# Patient Record
Sex: Female | Born: 1965 | Hispanic: Yes | Marital: Single | Smoking: Never smoker
Health system: Southern US, Community
[De-identification: ages and names within clinical notes are randomized; demographics above are authoritative.]

## PROBLEM LIST (undated history)

## (undated) DIAGNOSIS — E7800 Pure hypercholesterolemia, unspecified: Secondary | ICD-10-CM

## (undated) DIAGNOSIS — R519 Headache, unspecified: Secondary | ICD-10-CM

## (undated) HISTORY — PX: BACK SURGERY: SHX140

## (undated) HISTORY — PX: ABDOMINAL HYSTERECTOMY: SHX81

## (undated) HISTORY — PX: OOPHORECTOMY: SHX86

## (undated) HISTORY — PX: HALLUX VALGUS CORRECTION: SUR315

---

## 2016-11-17 ENCOUNTER — Other Ambulatory Visit: Payer: Self-pay | Admitting: Obstetrics and Gynecology

## 2016-11-17 DIAGNOSIS — Z1231 Encounter for screening mammogram for malignant neoplasm of breast: Secondary | ICD-10-CM

## 2016-12-12 ENCOUNTER — Encounter (HOSPITAL_COMMUNITY): Payer: Self-pay

## 2016-12-12 ENCOUNTER — Ambulatory Visit
Admission: RE | Admit: 2016-12-12 | Discharge: 2016-12-12 | Disposition: A | Discharge status: Home / Self Care | Payer: BLUE CROSS/BLUE SHIELD | Source: Ambulatory Visit | Attending: Obstetrics and Gynecology | Admitting: Obstetrics and Gynecology

## 2016-12-12 DIAGNOSIS — N6310 Unspecified lump in the right breast, unspecified quadrant: Secondary | ICD-10-CM | POA: Insufficient documentation

## 2016-12-12 DIAGNOSIS — Z1231 Encounter for screening mammogram for malignant neoplasm of breast: Secondary | ICD-10-CM | POA: Insufficient documentation

## 2016-12-23 ENCOUNTER — Other Ambulatory Visit: Payer: Self-pay | Admitting: Obstetrics and Gynecology

## 2016-12-23 DIAGNOSIS — N6310 Unspecified lump in the right breast, unspecified quadrant: Secondary | ICD-10-CM

## 2016-12-23 DIAGNOSIS — R928 Other abnormal and inconclusive findings on diagnostic imaging of breast: Secondary | ICD-10-CM

## 2017-01-02 ENCOUNTER — Ambulatory Visit
Admission: RE | Admit: 2017-01-02 | Discharge: 2017-01-02 | Disposition: A | Discharge status: Home / Self Care | Payer: BLUE CROSS/BLUE SHIELD | Source: Ambulatory Visit | Attending: Obstetrics and Gynecology | Admitting: Obstetrics and Gynecology

## 2017-01-02 DIAGNOSIS — N6310 Unspecified lump in the right breast, unspecified quadrant: Secondary | ICD-10-CM

## 2017-01-02 DIAGNOSIS — N6001 Solitary cyst of right breast: Secondary | ICD-10-CM | POA: Insufficient documentation

## 2017-01-02 DIAGNOSIS — R928 Other abnormal and inconclusive findings on diagnostic imaging of breast: Secondary | ICD-10-CM

## 2017-11-17 ENCOUNTER — Other Ambulatory Visit: Payer: Self-pay | Admitting: Obstetrics and Gynecology

## 2017-11-17 DIAGNOSIS — Z1231 Encounter for screening mammogram for malignant neoplasm of breast: Secondary | ICD-10-CM

## 2017-12-18 ENCOUNTER — Ambulatory Visit
Admission: RE | Admit: 2017-12-18 | Discharge: 2017-12-18 | Disposition: A | Discharge status: Home / Self Care | Payer: BLUE CROSS/BLUE SHIELD | Source: Ambulatory Visit | Attending: Obstetrics and Gynecology | Admitting: Obstetrics and Gynecology

## 2017-12-18 DIAGNOSIS — Z1231 Encounter for screening mammogram for malignant neoplasm of breast: Secondary | ICD-10-CM | POA: Diagnosis not present

## 2019-07-27 ENCOUNTER — Other Ambulatory Visit: Payer: Self-pay | Admitting: *Deleted

## 2019-07-27 DIAGNOSIS — Z20822 Contact with and (suspected) exposure to covid-19: Secondary | ICD-10-CM

## 2019-07-29 LAB — NOVEL CORONAVIRUS, NAA: SARS-CoV-2, NAA: NOT DETECTED

## 2019-11-25 ENCOUNTER — Other Ambulatory Visit: Payer: Self-pay | Admitting: Certified Nurse Midwife

## 2019-11-25 DIAGNOSIS — Z1231 Encounter for screening mammogram for malignant neoplasm of breast: Secondary | ICD-10-CM

## 2020-01-06 ENCOUNTER — Ambulatory Visit
Admission: RE | Admit: 2020-01-06 | Discharge: 2020-01-06 | Disposition: A | Discharge status: Home / Self Care | Payer: BLUE CROSS/BLUE SHIELD | Source: Ambulatory Visit | Attending: Certified Nurse Midwife | Admitting: Certified Nurse Midwife

## 2020-01-06 DIAGNOSIS — Z1231 Encounter for screening mammogram for malignant neoplasm of breast: Secondary | ICD-10-CM | POA: Insufficient documentation

## 2020-01-21 ENCOUNTER — Ambulatory Visit: Payer: BLUE CROSS/BLUE SHIELD | Attending: Internal Medicine

## 2020-01-21 DIAGNOSIS — Z23 Encounter for immunization: Secondary | ICD-10-CM

## 2020-01-21 NOTE — Progress Notes (Signed)
   Covid-19 Vaccination Clinic  Name:  Colleen Boyle    MRN: [id number] DOB: 1966-08-18  01/21/2020  [name] was observed post Covid-19 immunization for 15 minutes without incident. She was provided with Vaccine Information Sheet and instruction to access the V-Safe system.   [name] was instructed to call 911 with any severe reactions post vaccine: Marland Kitchen Difficulty breathing  . Swelling of face and throat  . A fast heartbeat  . A bad rash all over body  . Dizziness and weakness   Immunizations Administered    Name Date Dose VIS Date Route   Pfizer COVID-19 Vaccine 01/21/2020 10:55 AM 0.3 mL 10/14/2019 Intramuscular   Manufacturer: [place]   Lot: [place]   [place]: [id number]

## 2020-02-11 ENCOUNTER — Ambulatory Visit: Payer: BLUE CROSS/BLUE SHIELD | Attending: Internal Medicine

## 2020-02-11 DIAGNOSIS — Z23 Encounter for immunization: Secondary | ICD-10-CM

## 2020-02-11 NOTE — Progress Notes (Signed)
   Covid-19 Vaccination Clinic  Name:  Colleen Boyle    MRN: [id number] DOB: Jun 17, 1966  02/11/2020  [name] was observed post Covid-19 immunization for 15 minutes without incident. She was provided with Vaccine Information Sheet and instruction to access the V-Safe system.   [name] was instructed to call 911 with any severe reactions post vaccine: Marland Kitchen Difficulty breathing  . Swelling of face and throat  . A fast heartbeat  . A bad rash all over body  . Dizziness and weakness   Immunizations Administered    Name Date Dose VIS Date Route   Pfizer COVID-19 Vaccine 02/11/2020 10:43 AM 0.3 mL 10/14/2019 Intramuscular   Manufacturer: [place]   Lot: [id number]   [place]: [id number]

## 2021-01-09 ENCOUNTER — Other Ambulatory Visit: Payer: Self-pay | Admitting: Certified Nurse Midwife

## 2021-01-09 DIAGNOSIS — N644 Mastodynia: Secondary | ICD-10-CM

## 2021-01-25 ENCOUNTER — Other Ambulatory Visit: Payer: BLUE CROSS/BLUE SHIELD

## 2021-02-28 ENCOUNTER — Other Ambulatory Visit: Payer: Self-pay

## 2021-02-28 ENCOUNTER — Ambulatory Visit
Admission: RE | Admit: 2021-02-28 | Discharge: 2021-02-28 | Disposition: A | Discharge status: Home / Self Care | Payer: 59 | Source: Ambulatory Visit | Attending: Certified Nurse Midwife | Admitting: Certified Nurse Midwife

## 2021-02-28 DIAGNOSIS — N644 Mastodynia: Secondary | ICD-10-CM

## 2021-11-25 DIAGNOSIS — R7303 Prediabetes: Secondary | ICD-10-CM | POA: Diagnosis not present

## 2021-11-25 DIAGNOSIS — G43009 Migraine without aura, not intractable, without status migrainosus: Secondary | ICD-10-CM | POA: Diagnosis not present

## 2021-11-25 DIAGNOSIS — Z780 Asymptomatic menopausal state: Secondary | ICD-10-CM | POA: Diagnosis not present

## 2021-11-25 DIAGNOSIS — E7800 Pure hypercholesterolemia, unspecified: Secondary | ICD-10-CM | POA: Diagnosis not present

## 2022-01-03 ENCOUNTER — Encounter: Payer: Self-pay | Admitting: *Deleted

## 2022-01-06 ENCOUNTER — Encounter
Admission: RE | Disposition: A | Discharge status: Home / Self Care | Payer: Self-pay | Source: Home / Self Care | Attending: Gastroenterology

## 2022-01-06 ENCOUNTER — Encounter: Payer: Self-pay | Admitting: *Deleted

## 2022-01-06 ENCOUNTER — Ambulatory Visit: Payer: 59 | Admitting: Anesthesiology

## 2022-01-06 ENCOUNTER — Ambulatory Visit: Admit: 2022-01-06 | Payer: 59 | Admitting: Internal Medicine

## 2022-01-06 ENCOUNTER — Ambulatory Visit
Admission: RE | Admit: 2022-01-06 | Discharge: 2022-01-06 | Disposition: A | Discharge status: Home / Self Care | Payer: 59 | Attending: Gastroenterology | Admitting: Gastroenterology

## 2022-01-06 DIAGNOSIS — D123 Benign neoplasm of transverse colon: Secondary | ICD-10-CM | POA: Diagnosis not present

## 2022-01-06 DIAGNOSIS — K635 Polyp of colon: Secondary | ICD-10-CM | POA: Diagnosis not present

## 2022-01-06 DIAGNOSIS — E7800 Pure hypercholesterolemia, unspecified: Secondary | ICD-10-CM | POA: Insufficient documentation

## 2022-01-06 DIAGNOSIS — Z90721 Acquired absence of ovaries, unilateral: Secondary | ICD-10-CM | POA: Diagnosis not present

## 2022-01-06 DIAGNOSIS — K640 First degree hemorrhoids: Secondary | ICD-10-CM | POA: Insufficient documentation

## 2022-01-06 DIAGNOSIS — K649 Unspecified hemorrhoids: Secondary | ICD-10-CM | POA: Diagnosis not present

## 2022-01-06 DIAGNOSIS — R519 Headache, unspecified: Secondary | ICD-10-CM | POA: Diagnosis not present

## 2022-01-06 DIAGNOSIS — Z1211 Encounter for screening for malignant neoplasm of colon: Secondary | ICD-10-CM | POA: Insufficient documentation

## 2022-01-06 HISTORY — PX: COLONOSCOPY WITH PROPOFOL: SHX5780

## 2022-01-06 HISTORY — DX: Pure hypercholesterolemia, unspecified: E78.00

## 2022-01-06 HISTORY — DX: Headache, unspecified: R51.9

## 2022-01-06 SURGERY — COLONOSCOPY WITH PROPOFOL
Anesthesia: General

## 2022-01-06 MED ORDER — LIDOCAINE HCL (CARDIAC) PF 100 MG/5ML IV SOSY
PREFILLED_SYRINGE | INTRAVENOUS | Status: DC | PRN
  Administered 2022-01-06: 20 mg via INTRAVENOUS

## 2022-01-06 MED ORDER — PROPOFOL 10 MG/ML IV BOLUS
INTRAVENOUS | Status: AC
  Filled 2022-01-06: qty 40

## 2022-01-06 MED ORDER — PROPOFOL 10 MG/ML IV BOLUS
INTRAVENOUS | Status: DC | PRN
  Administered 2022-01-06: 30 mg via INTRAVENOUS
  Administered 2022-01-06: 70 mg via INTRAVENOUS

## 2022-01-06 MED ORDER — PROPOFOL 10 MG/ML IV BOLUS
INTRAVENOUS | Status: AC
  Filled 2022-01-06: qty 20

## 2022-01-06 MED ORDER — SODIUM CHLORIDE 0.9 % IV SOLN: INTRAVENOUS | Status: DC

## 2022-01-06 MED ORDER — PROPOFOL 500 MG/50ML IV EMUL
INTRAVENOUS | Status: DC | PRN
  Administered 2022-01-06: 175 ug/kg/min via INTRAVENOUS

## 2022-01-06 NOTE — Transfer of Care (Signed)
Immediate Anesthesia Transfer of Care Note ? ?Patient: Colleen Boyle ? ?Procedure(s) Performed: COLONOSCOPY WITH PROPOFOL ? ?Patient Location: Endoscopy Unit ? ?Anesthesia Type:General ? ?Level of Consciousness: awake ? ?Airway & Oxygen Therapy: Patient Spontanous Breathing ? ?Post-op Assessment: Report given to RN and Post -op Vital signs reviewed and stable ? ?Post vital signs: Reviewed and stable ? ?Last Vitals:  ?Vitals Value Taken Time  ?BP 92/58 01/06/22 1113  ?Temp    ?Pulse 69 01/06/22 1113  ?Resp 19 01/06/22 1113  ?SpO2 98 % 01/06/22 1113  ? ? ?Last Pain:  ?Vitals:  ? 01/06/22 1113  ?TempSrc:   ?PainSc: 0-No pain  ?   ? ?  ? ?Complications: No notable events documented. ?

## 2022-01-06 NOTE — Anesthesia Preprocedure Evaluation (Signed)
Anesthesia Evaluation  ?Patient identified by MRN, date of birth, ID band ?Patient awake ? ? ? ?Reviewed: ?Allergy & Precautions, H&P , NPO status , Patient's Chart, lab work & pertinent test results, reviewed documented beta blocker date and time  ? ?Airway ?Mallampati: II ? ? ?Neck ROM: full ? ? ? Dental ? ?(+) Poor Dentition ?  ?Pulmonary ?neg pulmonary ROS,  ?  ?Pulmonary exam normal ? ? ? ? ? ? ? Cardiovascular ?Exercise Tolerance: Good ?negative cardio ROS ?Normal cardiovascular exam ?Rhythm:regular Rate:Normal ? ? ?  ?Neuro/Psych ? Headaches, negative psych ROS  ? GI/Hepatic ?negative GI ROS, Neg liver ROS,   ?Endo/Other  ?negative endocrine ROS ? Renal/GU ?negative Renal ROS  ?negative genitourinary ?  ?Musculoskeletal ? ? Abdominal ?  ?Peds ? 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 Body Mass Index: 28.45 kg/m?  ?  ? Reproductive/Obstetrics ?negative OB ROS ? ?  ? ? ? ? ? ? ? ? ? ? ? ? ? ?  ?  ? ? ? ? ? ? ? ? ?Anesthesia Physical ?Anesthesia Plan ? ?ASA: 2 ? ?Anesthesia Plan: General  ? ?Post-op Pain Management:   ? ?Induction:  ? ?PONV Risk Score and Plan:  ? ?Airway Management Planned:  ? ?Additional Equipment:  ? ?Intra-op Plan:  ? ?Post-operative Plan:  ? ?Informed Consent: I have reviewed the patients History and Physical, chart, labs and discussed the procedure including the risks, benefits and alternatives for the proposed anesthesia with the patient or authorized representative who has indicated his/her understanding and acceptance.  ? ? ? ?Dental Advisory Given ? ?Plan Discussed with: CRNA ? ?Anesthesia Plan Comments:   ? ? ? ? ? ? ?Anesthesia Quick Evaluation ? ?

## 2022-01-06 NOTE — Interval H&P Note (Signed)
History and Physical Interval Note: ? ?01/06/2022 ?10:41 AM ? ?Colleen Boyle  has presented today for surgery, with the diagnosis of Screening.  The various methods of treatment have been discussed with the patient and family. After consideration of risks, benefits and other options for treatment, the patient has consented to  Procedure(s) with comments: ?COLONOSCOPY WITH PROPOFOL (N/A) - SPANISH INTERPRETER as a surgical intervention.  The patient's history has been reviewed, patient examined, no change in status, stable for surgery.  I have reviewed the patient's chart and labs.  Questions were answered to the patient's satisfaction.   ? ? ?Hilton Cork [place] ? ?Ok to proceed with colonoscopy ?

## 2022-01-06 NOTE — H&P (Signed)
Outpatient short stay form Pre-procedure ?01/06/2022  ?Lesly Rubenstein, MD ? ?Primary Physician: Linda Hedges, CNM ? ?Reason for visit:  Screening ? ?History of present illness:   ? ?56 y/o lady with no significant PMH here for index screening colonoscopy. No blood thinners. History of oophorectomy. No family history of GI malignancies. ? ? ? ?Current Facility-Administered Medications:  ?  0.9 %  sodium chloride infusion, , Intravenous, Continuous, Psalm Schappell, Hilton Cork, MD, Last Rate: 20 mL/hr at 01/06/22 1036, Continued from Pre-op at 01/06/22 1036 ? ?Medications Prior to Admission  ?Medication Sig Dispense Refill Last Dose  ? betamethasone dipropionate 0.05 % cream Apply 1 application topically 2 (two) times daily as needed.     ? calcium elemental as carbonate (TUMS ULTRA 1000) 400 MG chewable tablet Chew 1,000 mg by mouth every 2 (two) hours as needed for heartburn.     ? Cholecalciferol 100 MCG (4000 UT) CAPS Take 1 capsule by mouth daily.     ? nortriptyline (PAMELOR) 10 MG capsule Take 10 mg by mouth at bedtime.     ? senna (SENOKOT) 8.6 MG tablet Take 1 tablet by mouth daily.     ? simvastatin (ZOCOR) 40 MG tablet Take 40 mg by mouth daily.     ? SUMAtriptan (IMITREX) 50 MG tablet Take 50 mg by mouth every 2 (two) hours as needed for migraine. May repeat in 2 hours if headache persists or recurs. ?NO MORE THAN 2 TABLETS IN A DAY     ? ? ? ?No Known Allergies ? ? ?Past Medical History:  ?Diagnosis Date  ? Headache   ? Hypercholesteremia   ? ? ?Review of systems:  Otherwise negative.  ? ? ?Physical Exam ? ?Gen: Alert, oriented. Appears stated age.  ?HEENT: PERRLA. ?Lungs: No respiratory distress ?CV: RRR ?Abd: soft, benign, no masses ?Ext: No edema ? ? ? ?Planned procedures: Proceed with colonoscopy. The patient understands the nature of the planned procedure, indications, risks, alternatives and potential complications including but not limited to bleeding, infection, perforation, damage to internal organs  and possible oversedation/side effects from anesthesia. The patient agrees and gives consent to proceed.  ?Please refer to procedure notes for findings, recommendations and patient disposition/instructions.  ? ? ? ?Lesly Rubenstein, MD ?[facility] ? ? ? ?  ? ?

## 2022-01-06 NOTE — Op Note (Signed)
[facility] ?Gastroenterology ?Patient Name: Colleen Boyle ?Procedure Date: 01/06/2022 10:43 AM ?MRN: [id number] ?Account #: [id number] ?Date of Birth: [birth year] ?Admit Type: Outpatient ?Age: 56 ?Room: [facility] ENDO ROOM 3 ?Gender: Female ?Note Status: Finalized ?Instrument Name: Colonscope [id number] ?Procedure:             Colonoscopy ?Indications:           Screening for colorectal malignant neoplasm ?Providers:             Andrey Farmer MD, MD ?Referring MD:          Forest Gleason Md, MD (Referring MD) ?Medicines:             Monitored Anesthesia Care ?Complications:         No immediate complications. Estimated blood loss:  ?                       Minimal. ?Procedure:             Pre-Anesthesia Assessment: ?                       - Prior to the procedure, a History and Physical was  ?                       performed, and patient medications and allergies were  ?                       reviewed. The patient is competent. The risks and  ?                       benefits of the procedure and the sedation options and  ?                       risks were discussed with the patient. All questions  ?                       were answered and informed consent was obtained.  ?                       Patient identification and proposed procedure were  ?                       verified by the physician, the nurse, the  ?                       anesthesiologist, the anesthetist and the technician  ?                       in the endoscopy suite. Mental Status Examination:  ?                       alert and oriented. Airway Examination: normal  ?                       oropharyngeal airway and neck mobility. Respiratory  ?                       Examination: clear to auscultation. CV Examination:  ?  normal. Prophylactic Antibiotics: The patient does not  ?                       require prophylactic antibiotics. Prior  ?                       Anticoagulants: The patient has taken no previous  ?                        anticoagulant or antiplatelet agents. ASA Grade  ?                       Assessment: I - A normal, healthy patient. After  ?                       reviewing the risks and benefits, the patient was  ?                       deemed in satisfactory condition to undergo the  ?                       procedure. The anesthesia plan was to use monitored  ?                       anesthesia care (MAC). Immediately prior to  ?                       administration of medications, the patient was  ?                       re-assessed for adequacy to receive sedatives. The  ?                       heart rate, respiratory rate, oxygen saturations,  ?                       blood pressure, adequacy of pulmonary ventilation, and  ?                       response to care were monitored throughout the  ?                       procedure. The physical status of the patient was  ?                       re-assessed after the procedure. ?                       After obtaining informed consent, the colonoscope was  ?                       passed under direct vision. Throughout the procedure,  ?                       the patient's blood pressure, pulse, and oxygen  ?                       saturations were monitored continuously. The  ?  Colonoscope was introduced through the anus and  ?                       advanced to the the cecum, identified by appendiceal  ?                       orifice and ileocecal valve. The colonoscopy was  ?                       performed without difficulty. The patient tolerated  ?                       the procedure well. The quality of the bowel  ?                       preparation was good except the ascending colon was  ?                       poor. ?Findings: ?     The perianal and digital rectal examinations were normal. ?     Two sessile polyps were found in the proximal transverse colon. The  ?     polyps were 1 to 2 mm in size. These polyps were removed with a jumbo  ?     cold  forceps. Resection and retrieval were complete. Estimated blood  ?     loss was minimal. ?     Two sessile and semi-pedunculated polyps were found in the proximal  ?     transverse colon. The polyps were 3 to 4 mm in size. These polyps were  ?     removed with a cold snare. Resection and retrieval were complete.  ?     Estimated blood loss was minimal. ?     A large amount of stool was found in the ascending colon and in the  ?     cecum, precluding visualization. ?     Internal hemorrhoids were found during retroflexion. The hemorrhoids  ?     were Grade I (internal hemorrhoids that do not prolapse). ?     The exam was otherwise without abnormality on direct and retroflexion  ?     views. ?Impression:            - Two 1 to 2 mm polyps in the proximal transverse  ?                       colon, removed with a jumbo cold forceps. Resected and  ?                       retrieved. ?                       - Two 3 to 4 mm polyps in the proximal transverse  ?                       colon, removed with a cold snare. Resected and  ?                       retrieved. ?                       -  Stool in the ascending colon and in the cecum. ?                       - Internal hemorrhoids. ?                       - The examination was otherwise normal on direct and  ?                       retroflexion views. ?Recommendation:        - Discharge patient to home. ?                       - Resume previous diet. ?                       - Continue present medications. ?                       - Await pathology results. ?                       - Repeat colonoscopy in 6 months because the bowel  ?                       preparation was suboptimal. ?                       - Return to referring physician as previously  ?                       scheduled. ?Procedure Code(s):     --- Professional --- ?                       [phone], Colonoscopy, flexible; with removal of  ?                       tumor(s), polyp(s), or other lesion(s) by snare  ?                        technique ?                       [id number], 59, Colonoscopy, flexible; with biopsy, single  ?                       or multiple ?Diagnosis Code(s):     --- Professional --- ?                       Z12.11, Encounter for screening for malignant neoplasm  ?                       of colon ?                       K63.5, Polyp of colon ?                       K64.0, First degree hemorrhoids ?CPT copyright 2019 American Medical Association. All rights reserved. ?The codes documented in this report are preliminary and upon coder review may  ?be revised to meet current compliance  requirements. ?Andrey Farmer MD, MD ?01/06/2022 11:11:08 AM ?Number of Addenda: 0 ?Note Initiated On: 01/06/2022 10:43 AM ?Scope Withdrawal Time: 0 hours 2 minutes 42 seconds  ?Total Procedure Duration: 0 hours 13 minutes 33 seconds  ?Estimated Blood Loss:  Estimated blood loss was minimal. ?     [facility] ?

## 2022-01-07 ENCOUNTER — Encounter: Payer: Self-pay | Admitting: Gastroenterology

## 2022-01-07 LAB — SURGICAL PATHOLOGY

## 2022-01-09 ENCOUNTER — Other Ambulatory Visit: Payer: Self-pay | Admitting: Certified Nurse Midwife

## 2022-01-09 DIAGNOSIS — Z1231 Encounter for screening mammogram for malignant neoplasm of breast: Secondary | ICD-10-CM

## 2022-01-09 DIAGNOSIS — Z124 Encounter for screening for malignant neoplasm of cervix: Secondary | ICD-10-CM | POA: Diagnosis not present

## 2022-01-09 DIAGNOSIS — Z01419 Encounter for gynecological examination (general) (routine) without abnormal findings: Secondary | ICD-10-CM | POA: Diagnosis not present

## 2022-01-12 NOTE — Anesthesia Postprocedure Evaluation (Signed)
Anesthesia Post Note ? ?Patient: Colleen Boyle ? ?Procedure(s) Performed: COLONOSCOPY WITH PROPOFOL ? ?Patient location during evaluation: PACU ?Anesthesia Type: General ?Level of consciousness: awake and alert ?Pain management: pain level controlled ?Vital Signs Assessment: post-procedure vital signs reviewed and stable ?Respiratory status: spontaneous breathing, nonlabored ventilation, respiratory function stable and patient connected to nasal cannula oxygen ?Cardiovascular status: blood pressure returned to baseline and stable ?Postop Assessment: no apparent nausea or vomiting ?Anesthetic complications: no ? ? ?No notable events documented. ? ? ?Last Vitals:  ?Vitals:  ? 01/06/22 1116 01/06/22 1141  ?BP:  110/72  ?Pulse:    ?Resp:    ?Temp: (!) 35.6 ?C   ?SpO2:    ?  ?Last Pain:  ?Vitals:  ? 01/06/22 1156  ?TempSrc:   ?PainSc: 0-No pain  ? ? ?  ?  ?  ?  ?  ?  ? ?Molli Barrows ? ? ? ? ?

## 2022-03-07 ENCOUNTER — Ambulatory Visit
Admission: RE | Admit: 2022-03-07 | Discharge: 2022-03-07 | Disposition: A | Discharge status: Home / Self Care | Payer: 59 | Source: Ambulatory Visit | Attending: Certified Nurse Midwife | Admitting: Certified Nurse Midwife

## 2022-03-07 DIAGNOSIS — Z1231 Encounter for screening mammogram for malignant neoplasm of breast: Secondary | ICD-10-CM | POA: Insufficient documentation

## 2022-05-26 DIAGNOSIS — Z780 Asymptomatic menopausal state: Secondary | ICD-10-CM | POA: Diagnosis not present

## 2022-05-26 DIAGNOSIS — E7800 Pure hypercholesterolemia, unspecified: Secondary | ICD-10-CM | POA: Diagnosis not present

## 2022-05-26 DIAGNOSIS — R7303 Prediabetes: Secondary | ICD-10-CM | POA: Diagnosis not present

## 2022-05-30 DIAGNOSIS — M25561 Pain in right knee: Secondary | ICD-10-CM | POA: Diagnosis not present

## 2022-05-30 DIAGNOSIS — E7800 Pure hypercholesterolemia, unspecified: Secondary | ICD-10-CM | POA: Diagnosis not present

## 2022-05-30 DIAGNOSIS — Z0000 Encounter for general adult medical examination without abnormal findings: Secondary | ICD-10-CM | POA: Diagnosis not present

## 2022-05-30 DIAGNOSIS — G8929 Other chronic pain: Secondary | ICD-10-CM | POA: Diagnosis not present

## 2022-05-30 DIAGNOSIS — G43009 Migraine without aura, not intractable, without status migrainosus: Secondary | ICD-10-CM | POA: Diagnosis not present

## 2022-05-30 DIAGNOSIS — R7303 Prediabetes: Secondary | ICD-10-CM | POA: Diagnosis not present

## 2022-07-11 ENCOUNTER — Ambulatory Visit: Payer: 59 | Admitting: Anesthesiology

## 2022-07-11 ENCOUNTER — Ambulatory Visit
Admission: RE | Admit: 2022-07-11 | Discharge: 2022-07-11 | Disposition: A | Discharge status: Home / Self Care | Payer: 59 | Source: Ambulatory Visit | Attending: Gastroenterology | Admitting: Gastroenterology

## 2022-07-11 ENCOUNTER — Encounter
Admission: RE | Disposition: A | Discharge status: Home / Self Care | Payer: Self-pay | Source: Ambulatory Visit | Attending: Gastroenterology

## 2022-07-11 ENCOUNTER — Encounter: Payer: Self-pay | Admitting: *Deleted

## 2022-07-11 DIAGNOSIS — K640 First degree hemorrhoids: Secondary | ICD-10-CM | POA: Diagnosis not present

## 2022-07-11 DIAGNOSIS — Z1211 Encounter for screening for malignant neoplasm of colon: Secondary | ICD-10-CM | POA: Insufficient documentation

## 2022-07-11 DIAGNOSIS — E7800 Pure hypercholesterolemia, unspecified: Secondary | ICD-10-CM | POA: Insufficient documentation

## 2022-07-11 DIAGNOSIS — Z86010 Personal history of colonic polyps: Secondary | ICD-10-CM | POA: Diagnosis not present

## 2022-07-11 DIAGNOSIS — Z90721 Acquired absence of ovaries, unilateral: Secondary | ICD-10-CM | POA: Insufficient documentation

## 2022-07-11 DIAGNOSIS — Z90710 Acquired absence of both cervix and uterus: Secondary | ICD-10-CM | POA: Diagnosis not present

## 2022-07-11 HISTORY — PX: COLONOSCOPY WITH PROPOFOL: SHX5780

## 2022-07-11 SURGERY — COLONOSCOPY WITH PROPOFOL
Anesthesia: General

## 2022-07-11 MED ORDER — SODIUM CHLORIDE 0.9 % IV SOLN: INTRAVENOUS | Status: DC

## 2022-07-11 MED ORDER — EPHEDRINE 5 MG/ML INJ
INTRAVENOUS | Status: AC
  Filled 2022-07-11: qty 5

## 2022-07-11 MED ORDER — PROPOFOL 500 MG/50ML IV EMUL
INTRAVENOUS | Status: DC | PRN
  Administered 2022-07-11: 150 ug/kg/min via INTRAVENOUS

## 2022-07-11 MED ORDER — PROPOFOL 10 MG/ML IV BOLUS
INTRAVENOUS | Status: DC | PRN
  Administered 2022-07-11: 70 mg via INTRAVENOUS

## 2022-07-11 MED ORDER — PHENYLEPHRINE HCL (PRESSORS) 10 MG/ML IV SOLN
INTRAVENOUS | Status: DC | PRN
  Administered 2022-07-11: 80 ug via INTRAVENOUS

## 2022-07-11 MED ORDER — PHENYLEPHRINE 80 MCG/ML (10ML) SYRINGE FOR IV PUSH (FOR BLOOD PRESSURE SUPPORT)
PREFILLED_SYRINGE | INTRAVENOUS | Status: AC
  Filled 2022-07-11: qty 10

## 2022-07-11 MED ORDER — EPHEDRINE SULFATE (PRESSORS) 50 MG/ML IJ SOLN
INTRAMUSCULAR | Status: DC | PRN
  Administered 2022-07-11: 10 mg via INTRAVENOUS
  Administered 2022-07-11 (×3): 5 mg via INTRAVENOUS

## 2022-07-11 NOTE — Anesthesia Procedure Notes (Signed)
Date/Time: 07/11/2022 10:05 AM  Performed by: Demetrius Charity, CRNAPre-anesthesia Checklist: Patient identified, Emergency Drugs available, Suction available, Patient being monitored and Timeout performed Patient Re-evaluated:Patient Re-evaluated prior to induction Oxygen Delivery Method: Nasal cannula Induction Type: IV induction Placement Confirmation: CO2 detector and positive ETCO2

## 2022-07-11 NOTE — H&P (Signed)
Outpatient short stay form Pre-procedure 07/11/2022  Lesly Rubenstein, MD  Primary Physician: Linda Hedges, CNM  Reason for visit:  Surveillance colonoscopy  History of present illness:    56 y/o lady with no significant PMH here for colonoscopy. Last colonoscopy 6 months ago with poor prep. No blood thinners. History of oophorectomy. No family history of GI malignancies.    Current Facility-Administered Medications:    0.9 %  sodium chloride infusion, , Intravenous, Continuous, Martyna Thorns, Hilton Cork, MD, Last Rate: 20 mL/hr at 07/11/22 0950, Continued from Pre-op at 07/11/22 0950  Medications Prior to Admission  Medication Sig Dispense Refill Last Dose   calcium elemental as carbonate (TUMS ULTRA 1000) 400 MG chewable tablet Chew 1,000 mg by mouth every 2 (two) hours as needed for heartburn.   Past Week   Cholecalciferol 100 MCG (4000 UT) CAPS Take 1 capsule by mouth daily.   Past Week   nortriptyline (PAMELOR) 10 MG capsule Take 10 mg by mouth at bedtime.   Past Week   senna (SENOKOT) 8.6 MG tablet Take 1 tablet by mouth daily.   Past Week   simvastatin (ZOCOR) 40 MG tablet Take 40 mg by mouth daily.   Past Week   betamethasone dipropionate 0.05 % cream Apply 1 application topically 2 (two) times daily as needed.      SUMAtriptan (IMITREX) 50 MG tablet Take 50 mg by mouth every 2 (two) hours as needed for migraine. May repeat in 2 hours if headache persists or recurs. NO MORE THAN 2 TABLETS IN A DAY        No Known Allergies   Past Medical History:  Diagnosis Date   Headache    Hypercholesteremia     Review of systems:  Otherwise negative.    Physical Exam  Gen: Alert, oriented. Appears stated age.  HEENT: PERRLA. Lungs: No respiratory distress CV: RRR Abd: soft, benign, no masses Ext: No edema    Planned procedures: Proceed with colonoscopy. The patient understands the nature of the planned procedure, indications, risks, alternatives and potential  complications including but not limited to bleeding, infection, perforation, damage to internal organs and possible oversedation/side effects from anesthesia. The patient agrees and gives consent to proceed.  Please refer to procedure notes for findings, recommendations and patient disposition/instructions.     Lesly Rubenstein, MD [facility]

## 2022-07-11 NOTE — Op Note (Addendum)
[facility] Gastroenterology Patient Name: Colleen Boyle Procedure Date: 07/11/2022 9:52 AM MRN: [id number] Account #: [id number] Date of Birth: 1966/02/04 Admit Type: Outpatient Age: 56 Room: [facility] ENDO ROOM 3 Gender: Female Note Status: Finalized Instrument Name: Jasper Riling [id number] Procedure:             Colonoscopy Indications:           Surveillance: Personal history of adenomatous polyps,                         inadequate prep on last colonoscopy (less than 1 year                         ago) Providers:             Andrey Farmer MD, MD Referring MD:          No Local Md, MD (Referring MD) Medicines:             Monitored Anesthesia Care Complications:         No immediate complications. Procedure:             Pre-Anesthesia Assessment:                        - Prior to the procedure, a History and Physical was                         performed, and patient medications and allergies were                         reviewed. The patient is competent. The risks and                         benefits of the procedure and the sedation options and                         risks were discussed with the patient. All questions                         were answered and informed consent was obtained.                         Patient identification and proposed procedure were                         verified by the physician, the nurse, the                         anesthesiologist, the anesthetist and the technician                         in the endoscopy suite. Mental Status Examination:                         alert and oriented. Airway Examination: normal                         oropharyngeal airway and neck mobility. Respiratory  Examination: clear to auscultation. CV Examination:                         normal. Prophylactic Antibiotics: The patient does not                         require prophylactic antibiotics. Prior                          Anticoagulants: The patient has taken no previous                         anticoagulant or antiplatelet agents. ASA Grade                         Assessment: II - A patient with mild systemic disease.                         After reviewing the risks and benefits, the patient                         was deemed in satisfactory condition to undergo the                         procedure. The anesthesia plan was to use monitored                         anesthesia care (MAC). Immediately prior to                         administration of medications, the patient was                         re-assessed for adequacy to receive sedatives. The                         heart rate, respiratory rate, oxygen saturations,                         blood pressure, adequacy of pulmonary ventilation, and                         response to care were monitored throughout the                         procedure. The physical status of the patient was                         re-assessed after the procedure.                        After obtaining informed consent, the colonoscope was                         passed under direct vision. Throughout the procedure,                         the patient's blood pressure, pulse, and oxygen  saturations were monitored continuously. The                         Colonoscope was introduced through the anus and                         advanced to the the cecum, identified by appendiceal                         orifice and ileocecal valve. The colonoscopy was                         performed without difficulty. The patient tolerated                         the procedure well. The quality of the bowel                         preparation was good. Findings:      The perianal and digital rectal examinations were normal.      Internal hemorrhoids were found during retroflexion. The hemorrhoids       were Grade I (internal hemorrhoids that do not prolapse).       The exam was otherwise without abnormality on direct and retroflexion       views. Impression:            - Internal hemorrhoids.                        - The examination was otherwise normal on direct and                         retroflexion views.                        - No specimens collected. Recommendation:        - Discharge patient to home.                        - Resume previous diet.                        - Continue present medications.                        - Repeat colonoscopy in 5 years for surveillance.                        - Return to referring physician as previously                         scheduled. Procedure Code(s):     --- Professional ---                        G9924, Colorectal cancer screening; colonoscopy on                         individual at high risk Diagnosis Code(s):     --- Professional ---  Z86.010, Personal history of colonic polyps                        K64.0, First degree hemorrhoids CPT copyright 2019 American Medical Association. All rights reserved. The codes documented in this report are preliminary and upon coder review may  be revised to meet current compliance requirements. Andrey Farmer MD, MD 07/11/2022 10:34:37 AM Number of Addenda: 0 Note Initiated On: 07/11/2022 9:52 AM Scope Withdrawal Time: 0 hours 10 minutes 39 seconds  Total Procedure Duration: 0 hours 14 minutes 14 seconds  Estimated Blood Loss:  Estimated blood loss: none.      [facility]

## 2022-07-11 NOTE — Anesthesia Postprocedure Evaluation (Signed)
Anesthesia Post Note  Patient: Colleen Boyle  Procedure(s) Performed: COLONOSCOPY WITH PROPOFOL  Patient location during evaluation: Endoscopy Anesthesia Type: General Level of consciousness: awake and alert Pain management: pain level controlled Vital Signs Assessment: post-procedure vital signs reviewed and stable Respiratory status: spontaneous breathing, nonlabored ventilation, respiratory function stable and patient connected to nasal cannula oxygen Cardiovascular status: blood pressure returned to baseline and stable Postop Assessment: no apparent nausea or vomiting Anesthetic complications: no   No notable events documented.   Last Vitals:  Vitals:   07/11/22 1044 07/11/22 1047  BP: 104/61 107/61  Pulse: 72 69  Resp: 13 15  Temp:    SpO2: 100% 100%    Last Pain:  Vitals:   07/11/22 1020  TempSrc: Temporal  PainSc:                  Precious Haws Sherrelle Prochazka

## 2022-07-11 NOTE — Transfer of Care (Signed)
Immediate Anesthesia Transfer of Care Note  Patient: Colleen Boyle  Procedure(s) Performed: COLONOSCOPY WITH PROPOFOL  Patient Location: PACU  Anesthesia Type:General  Level of Consciousness: drowsy  Airway & Oxygen Therapy: Patient Spontanous Breathing  Post-op Assessment: Report given to RN and Post -op Vital signs reviewed and stable  Post vital signs: Reviewed and stable  Last Vitals:  Vitals Value Taken Time  BP 93/68   Temp    Pulse 77 07/11/22 1026  Resp 21 07/11/22 1026  SpO2 100 % 07/11/22 1026  Vitals shown include unvalidated device data.  Last Pain:  Vitals:   07/11/22 0902  TempSrc: Temporal  PainSc: 1          Complications: No notable events documented.

## 2022-07-11 NOTE — Interval H&P Note (Signed)
History and Physical Interval Note:  07/11/2022 10:01 AM  Colleen Boyle  has presented today for surgery, with the diagnosis of PH polyps.  The various methods of treatment have been discussed with the patient and family. After consideration of risks, benefits and other options for treatment, the patient has consented to  Procedure(s) with comments: COLONOSCOPY WITH PROPOFOL (N/A) - SPANISH INTERPRETER as a surgical intervention.  The patient's history has been reviewed, patient examined, no change in status, stable for surgery.  I have reviewed the patient's chart and labs.  Questions were answered to the patient's satisfaction.     Lesly Rubenstein  Ok to proceed with colonoscopy

## 2022-07-11 NOTE — Anesthesia Preprocedure Evaluation (Signed)
Anesthesia Evaluation  Patient identified by MRN, date of birth, ID band Patient awake    Reviewed: Allergy & Precautions, NPO status , Patient's Chart, lab work & pertinent test results  History of Anesthesia Complications Negative for: history of anesthetic complications  Airway Mallampati: III  TM Distance: <3 FB Neck ROM: full    Dental  (+) Chipped, Poor Dentition, Missing, Lower Dentures   Pulmonary neg pulmonary ROS, neg shortness of breath,    Pulmonary exam normal        Cardiovascular Exercise Tolerance: Good (-) anginanegative cardio ROS Normal cardiovascular exam     Neuro/Psych  Headaches, negative psych ROS   GI/Hepatic negative GI ROS, Neg liver ROS, neg GERD  ,  Endo/Other  negative endocrine ROS  Renal/GU negative Renal ROS  negative genitourinary   Musculoskeletal   Abdominal   Peds  Hematology negative hematology ROS (+)   Anesthesia Other Findings Past Medical History: No date: Headache No date: Hypercholesteremia  Past Surgical History: No date: ABDOMINAL HYSTERECTOMY No date: BACK SURGERY 01/06/2022: COLONOSCOPY WITH PROPOFOL; N/A     Comment:  Procedure: COLONOSCOPY WITH PROPOFOL;  Surgeon:               Lesly Rubenstein, MD;  Location: [facility] END[facility]COPY;                Service: Endoscopy;  Laterality: N/A;  SPANISH               INTERPRETER No date: HALLUX VALGUS CORRECTION; Bilateral No date: OOPHORECTOMY     Reproductive/Obstetrics negative OB ROS                             Anesthesia Physical Anesthesia Plan  ASA: 2  Anesthesia Plan: General   Post-op Pain Management:    Induction: Intravenous  PONV Risk Score and Plan: Propofol infusion and TIVA  Airway Management Planned: Natural Airway and Nasal Cannula  Additional Equipment:   Intra-op Plan:   Post-operative Plan:   Informed Consent: I have reviewed the patients History and  Physical, chart, labs and discussed the procedure including the risks, benefits and alternatives for the proposed anesthesia with the patient or authorized representative who has indicated his/her understanding and acceptance.     Dental Advisory Given and Interpreter used for interveiw  Plan Discussed with: Anesthesiologist, CRNA and Surgeon  Anesthesia Plan Comments: (Patient consented for risks of anesthesia including but not limited to:  - adverse reactions to medications - risk of airway placement if required - damage to eyes, teeth, lips or other oral mucosa - nerve damage due to positioning  - sore throat or hoarseness - Damage to heart, brain, nerves, lungs, other parts of body or loss of life  Patient voiced understanding.)        Anesthesia Quick Evaluation

## 2022-11-28 DIAGNOSIS — E7800 Pure hypercholesterolemia, unspecified: Secondary | ICD-10-CM | POA: Diagnosis not present

## 2022-11-28 DIAGNOSIS — R7303 Prediabetes: Secondary | ICD-10-CM | POA: Diagnosis not present

## 2022-11-28 DIAGNOSIS — Z780 Asymptomatic menopausal state: Secondary | ICD-10-CM | POA: Diagnosis not present

## 2022-11-28 DIAGNOSIS — G43009 Migraine without aura, not intractable, without status migrainosus: Secondary | ICD-10-CM | POA: Diagnosis not present

## 2023-01-16 ENCOUNTER — Other Ambulatory Visit: Payer: Self-pay | Admitting: Certified Nurse Midwife

## 2023-01-16 DIAGNOSIS — Z124 Encounter for screening for malignant neoplasm of cervix: Secondary | ICD-10-CM | POA: Diagnosis not present

## 2023-01-16 DIAGNOSIS — Z1231 Encounter for screening mammogram for malignant neoplasm of breast: Secondary | ICD-10-CM | POA: Diagnosis not present

## 2023-01-16 DIAGNOSIS — Z01419 Encounter for gynecological examination (general) (routine) without abnormal findings: Secondary | ICD-10-CM | POA: Diagnosis not present

## 2023-03-25 ENCOUNTER — Ambulatory Visit
Admission: EM | Admit: 2023-03-25 | Discharge: 2023-03-25 | Disposition: A | Discharge status: Home / Self Care | Payer: 59 | Attending: Emergency Medicine | Admitting: Emergency Medicine

## 2023-03-25 DIAGNOSIS — B349 Viral infection, unspecified: Secondary | ICD-10-CM | POA: Diagnosis not present

## 2023-03-25 LAB — POCT RAPID STREP A (OFFICE): Rapid Strep A Screen: NEGATIVE

## 2023-03-25 NOTE — Discharge Instructions (Addendum)
Take Tylenol or ibuprofen as needed for fever or discomfort.  Rest and keep yourself hydrated.    Follow-up with your primary care provider if your symptoms are not improving.     

## 2023-03-25 NOTE — ED Provider Notes (Signed)
[name]    CSN: [id number] Arrival date & time: 03/25/23  1609      History   Chief Complaint Chief Complaint  Patient presents with   Sore Throat    HPI Colleen Boyle is a 57 y.o. female.  Accompanied by her son, patient presents with chills, body aches, headache, sore throat x 1 day.  She took Tylenol this morning.  She denies fever, rash, cough, shortness of breath, vomiting, diarrhea, or other symptoms.  Patient declines interpreter.    The history is provided by the patient, a relative and medical records. No language interpreter was used (Patient declined.).    Past Medical History:  Diagnosis Date   Headache    Hypercholesteremia     There are no problems to display for this patient.   Past Surgical History:  Procedure Laterality Date   ABDOMINAL HYSTERECTOMY     BACK SURGERY     COLONOSCOPY WITH PROPOFOL N/A 01/06/2022   Procedure: COLONOSCOPY WITH PROPOFOL;  Surgeon: Regis Bill, MD;  Location: [facility] END[facility]COPY;  Service: Endoscopy;  Laterality: N/A;  SPANISH INTERPRETER   COLONOSCOPY WITH PROPOFOL N/A 07/11/2022   Procedure: COLONOSCOPY WITH PROPOFOL;  Surgeon: Regis Bill, MD;  Location: [facility] ENDOSCOPY;  Service: Endoscopy;  Laterality: N/A;  SPANISH INTERPRETER   HALLUX VALGUS CORRECTION Bilateral    OOPHORECTOMY      OB History   No obstetric history on file.      Home Medications    Prior to Admission medications   Medication Sig Start Date End Date Taking? Authorizing Provider  calcium elemental as carbonate (TUMS ULTRA 1000) 400 MG chewable tablet Chew 1,000 mg by mouth every 2 (two) hours as needed for heartburn.   Yes [provider]  Cholecalciferol 100 MCG (4000 UT) CAPS Take 1 capsule by mouth daily.   Yes [provider]  nortriptyline (PAMELOR) 10 MG capsule Take 10 mg by mouth at bedtime.   Yes [provider]  simvastatin (ZOCOR) 40 MG tablet Take 40 mg by mouth daily.   Yes [provider]  SUMAtriptan (IMITREX) 50 MG tablet Take 50 mg by mouth every 2 (two) hours as needed for migraine. May repeat in 2 hours if headache persists or recurs. NO MORE THAN 2 TABLETS IN A DAY   Yes [provider]  betamethasone dipropionate 0.05 % cream Apply 1 application topically 2 (two) times daily as needed.    [provider]  senna (SENOKOT) 8.6 MG tablet Take 1 tablet by mouth daily.    [provider]    Family History Family History  Problem Relation Age of Onset   Breast cancer Maternal Aunt 65    Social History Social History   Tobacco Use   Smoking status: Never   Smokeless tobacco: Never  Vaping Use   Vaping Use: Never used  Substance Use Topics   Alcohol use: Never   Drug use: Never     Allergies   Patient has no known allergies.   Review of Systems Review of Systems  Constitutional:  Positive for chills and fatigue. Negative for fever.  HENT:  Positive for sore throat. Negative for ear pain.   Respiratory:  Negative for cough and shortness of breath.   Cardiovascular:  Negative for chest pain and palpitations.  Gastrointestinal:  Negative for diarrhea and vomiting.  Skin:  Negative for rash.  Neurological:  Positive for headaches.     Physical Exam Triage Vital Signs ED  Triage Vitals [03/25/23 1802]  Enc Vitals Group     BP (!) 169/95     Pulse Rate 75     Resp 18     Temp 99.7 F (37.6 C)     Temp Source Oral     SpO2 95 %     Weight      Height      Head Circumference      Peak Flow      Pain Score 7     Pain Loc      Pain Edu?      Excl. in GC?    No data found.  Updated Vital Signs BP 123/76 (BP Location: Right Arm)   Pulse 75   Temp 99.7 F (37.6 C) (Oral)   Resp 18   SpO2 95%   Visual Acuity Right Eye Distance:   Left Eye Distance:   Bilateral Distance:    Right Eye Near:   Left Eye Near:    Bilateral Near:     Physical Exam Vitals and nursing note reviewed.  Constitutional:       General: She is not in acute distress.    Appearance: Normal appearance. She is well-developed. She is not ill-appearing.  HENT:     Right Ear: Tympanic membrane normal.     Left Ear: Tympanic membrane normal.     Nose: Nose normal.     Mouth/Throat:     Mouth: Mucous membranes are moist.     Pharynx: Oropharynx is clear.  Cardiovascular:     Rate and Rhythm: Normal rate and regular rhythm.     Heart sounds: Normal heart sounds.  Pulmonary:     Effort: Pulmonary effort is normal. No respiratory distress.     Breath sounds: Normal breath sounds.  Musculoskeletal:     Cervical back: Neck supple.  Skin:    General: Skin is warm and dry.  Neurological:     Mental Status: She is alert.  Psychiatric:        Mood and Affect: Mood normal.        Behavior: Behavior normal.      UC Treatments / Results  Labs (all labs ordered are listed, but only abnormal results are displayed) Labs Reviewed  POCT RAPID STREP A (OFFICE)    EKG   Radiology No results found.  Procedures Procedures (including critical care time)  Medications Ordered in UC Medications - No data to display  Initial Impression / Assessment and Plan / UC Course  I have reviewed the triage vital signs and the nursing notes.  Pertinent labs & imaging results that were available during my care of the patient were reviewed by me and considered in my medical decision making (see chart for details).    Viral illness.  Rapid strep negative.  Discussed symptomatic treatment including Tylenol or ibuprofen, rest, hydration.  Instructed patient to follow up with her PCP if symptoms are not improving.  She agrees to plan of care.   Final Clinical Impressions(s) / UC Diagnoses   Final diagnoses:  Viral illness     Discharge Instructions      Take Tylenol or ibuprofen as needed for fever or discomfort.  Rest and keep yourself hydrated.    Follow-up with your primary care provider if your symptoms are not  improving.         ED Prescriptions   None    PDMP not reviewed this encounter.   Mickie Bail, NP 03/25/23 [phone]

## 2023-03-25 NOTE — ED Triage Notes (Signed)
Pt states she is having chills, body aches, headache, sore throat that started yesterday. Taking tylenol.

## 2023-04-24 ENCOUNTER — Ambulatory Visit
Admission: RE | Admit: 2023-04-24 | Discharge: 2023-04-24 | Disposition: A | Discharge status: Home / Self Care | Payer: 59 | Source: Ambulatory Visit | Attending: Certified Nurse Midwife | Admitting: Certified Nurse Midwife

## 2023-04-24 DIAGNOSIS — Z1231 Encounter for screening mammogram for malignant neoplasm of breast: Secondary | ICD-10-CM | POA: Diagnosis not present

## 2023-05-28 DIAGNOSIS — G43009 Migraine without aura, not intractable, without status migrainosus: Secondary | ICD-10-CM | POA: Diagnosis not present

## 2023-05-28 DIAGNOSIS — R7303 Prediabetes: Secondary | ICD-10-CM | POA: Diagnosis not present

## 2023-05-28 DIAGNOSIS — E7800 Pure hypercholesterolemia, unspecified: Secondary | ICD-10-CM | POA: Diagnosis not present

## 2023-06-04 DIAGNOSIS — R7303 Prediabetes: Secondary | ICD-10-CM | POA: Diagnosis not present

## 2023-06-04 DIAGNOSIS — G43009 Migraine without aura, not intractable, without status migrainosus: Secondary | ICD-10-CM | POA: Diagnosis not present

## 2023-06-04 DIAGNOSIS — Z780 Asymptomatic menopausal state: Secondary | ICD-10-CM | POA: Diagnosis not present

## 2023-06-04 DIAGNOSIS — Z0000 Encounter for general adult medical examination without abnormal findings: Secondary | ICD-10-CM | POA: Diagnosis not present

## 2023-06-04 DIAGNOSIS — E7800 Pure hypercholesterolemia, unspecified: Secondary | ICD-10-CM | POA: Diagnosis not present

## 2023-07-15 DIAGNOSIS — S46811A Strain of other muscles, fascia and tendons at shoulder and upper arm level, right arm, initial encounter: Secondary | ICD-10-CM | POA: Diagnosis not present

## 2023-07-15 DIAGNOSIS — S46911A Strain of unspecified muscle, fascia and tendon at shoulder and upper arm level, right arm, initial encounter: Secondary | ICD-10-CM | POA: Diagnosis not present

## 2023-07-15 DIAGNOSIS — M7551 Bursitis of right shoulder: Secondary | ICD-10-CM | POA: Diagnosis not present

## 2023-12-13 IMAGING — MG MM DIGITAL SCREENING BILAT W/ TOMO AND CAD
8 series · 8 of 24 positions shown · non-contrast
Comparison: Previous exam(s).

CLINICAL DATA: Screening.

EXAM:
DIGITAL SCREENING BILATERAL MAMMOGRAM WITH TOMOSYNTHESIS AND CAD
TECHNIQUE: Bilateral screening digital craniocaudal and mediolateral oblique
mammograms were obtained. Bilateral screening digital breast
tomosynthesis was performed. The images were evaluated with
computer-aided detection.

[R CC synth-2D]
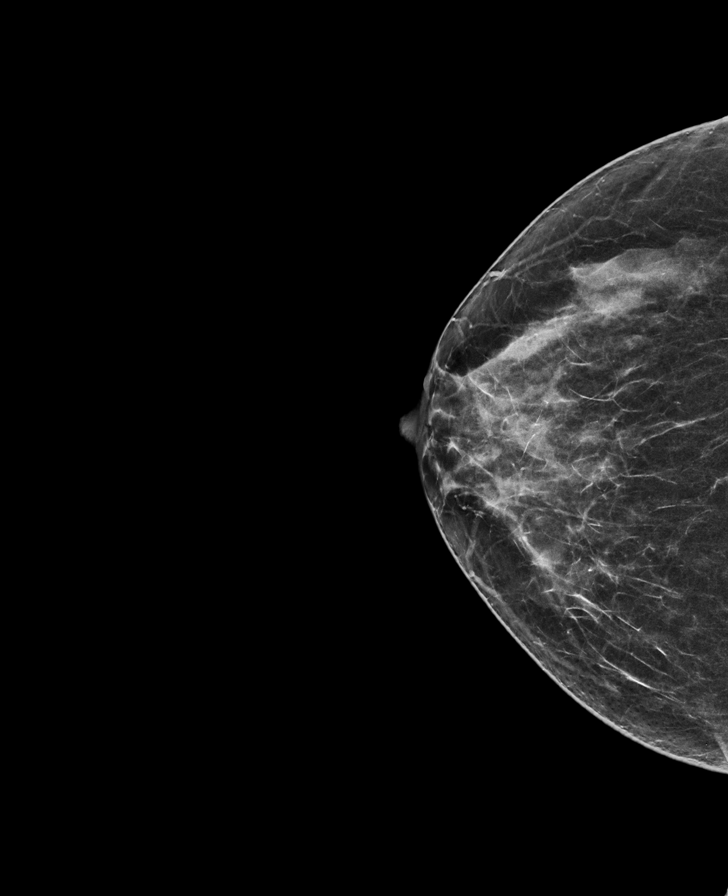

[L MLO synth-2D]
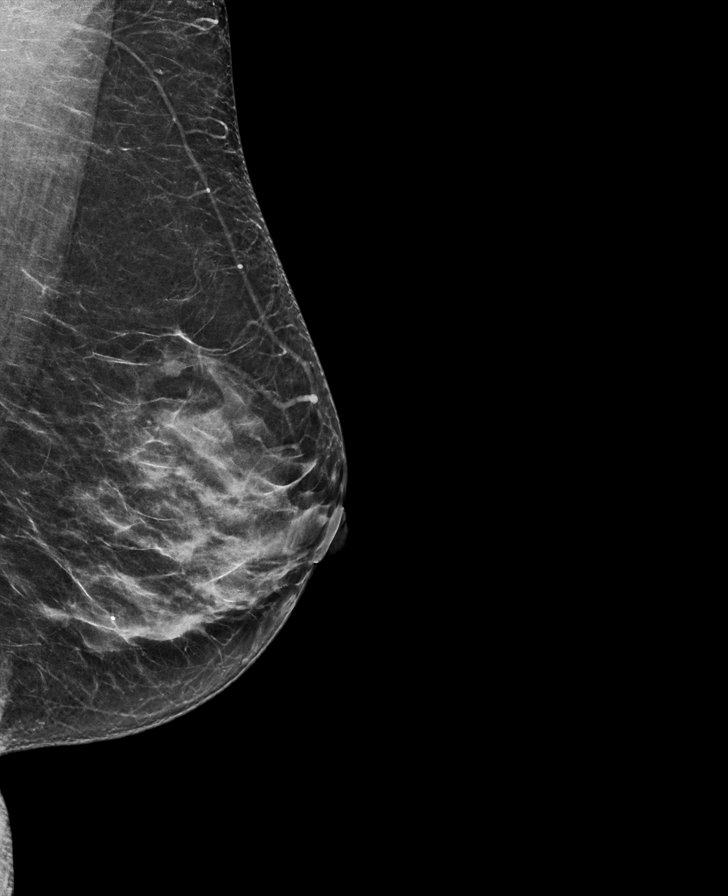

[L CC synth-2D]
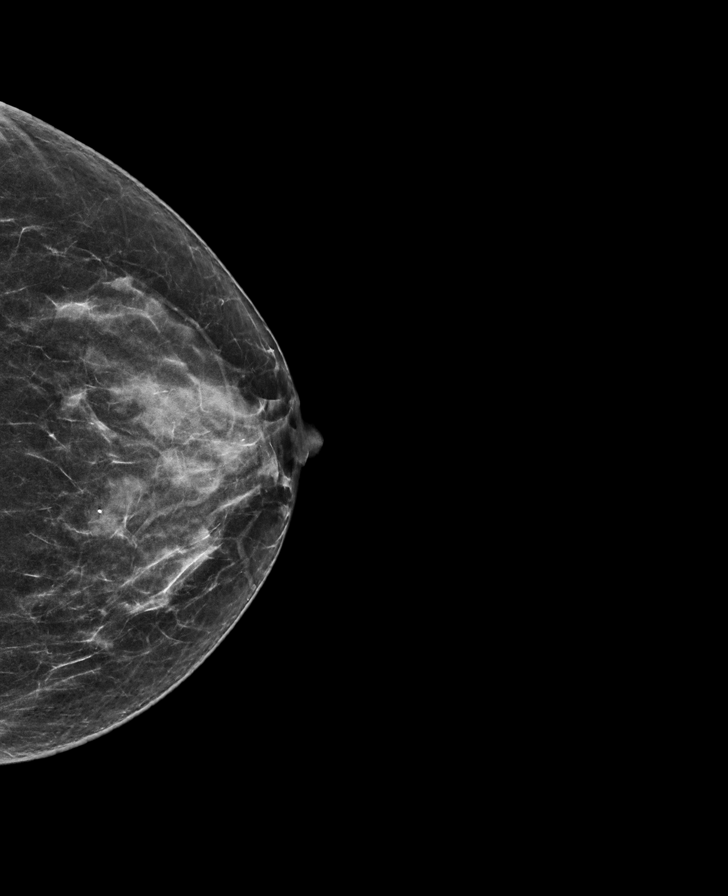

[R MLO synth-2D]
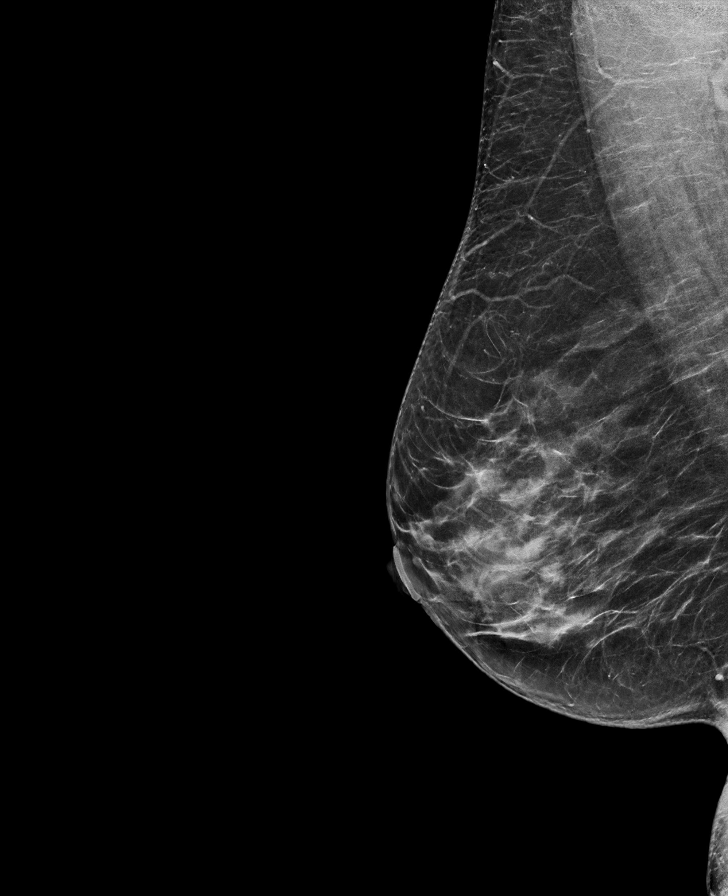

[R CC tomo · tomo slice 34/67.0]
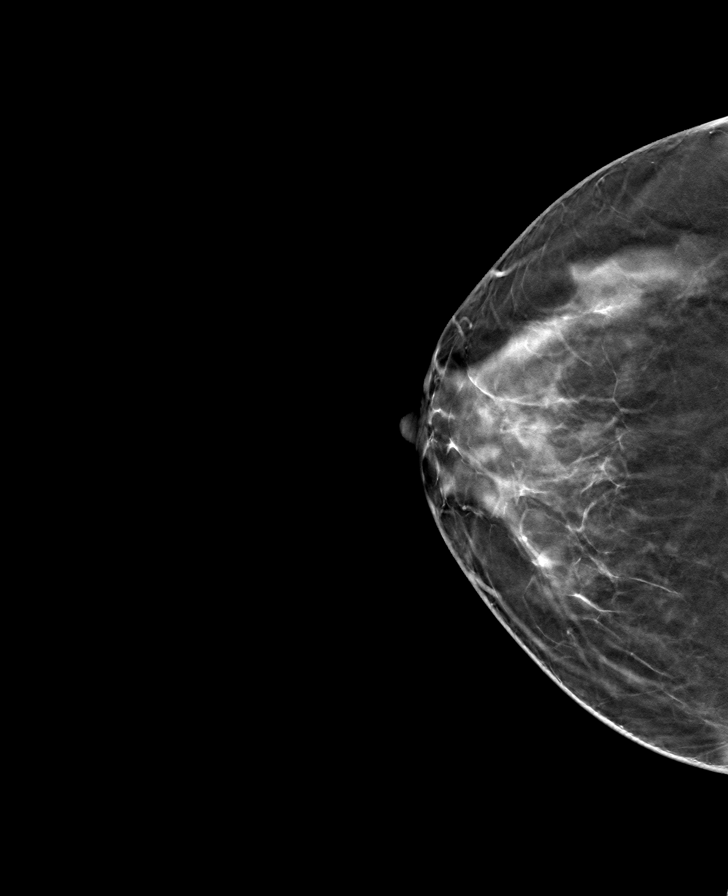

[R MLO tomo · tomo slice 38/75.0]
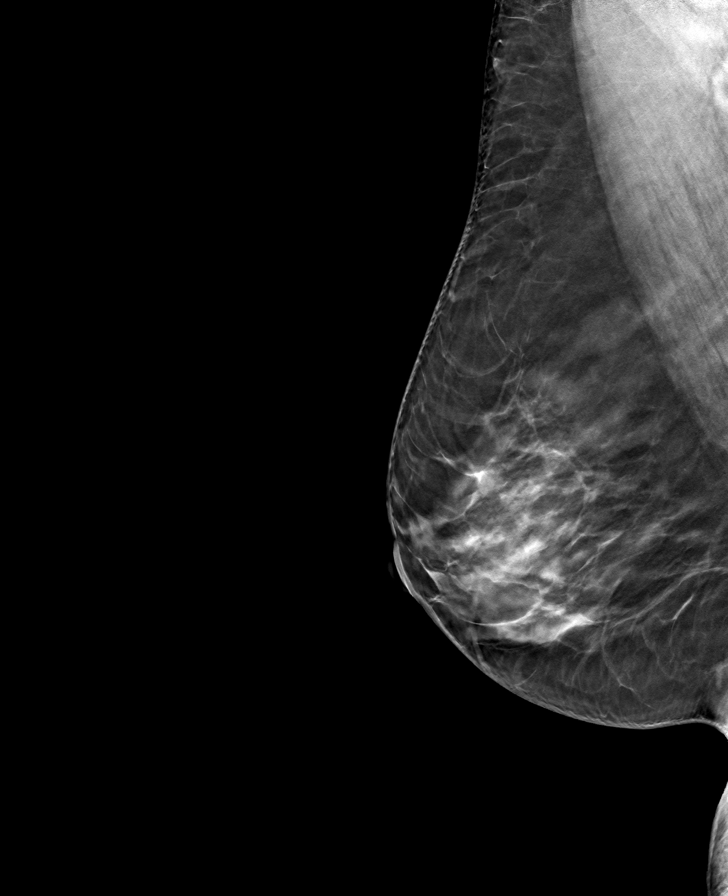

[L CC tomo · tomo slice 35/68.0]
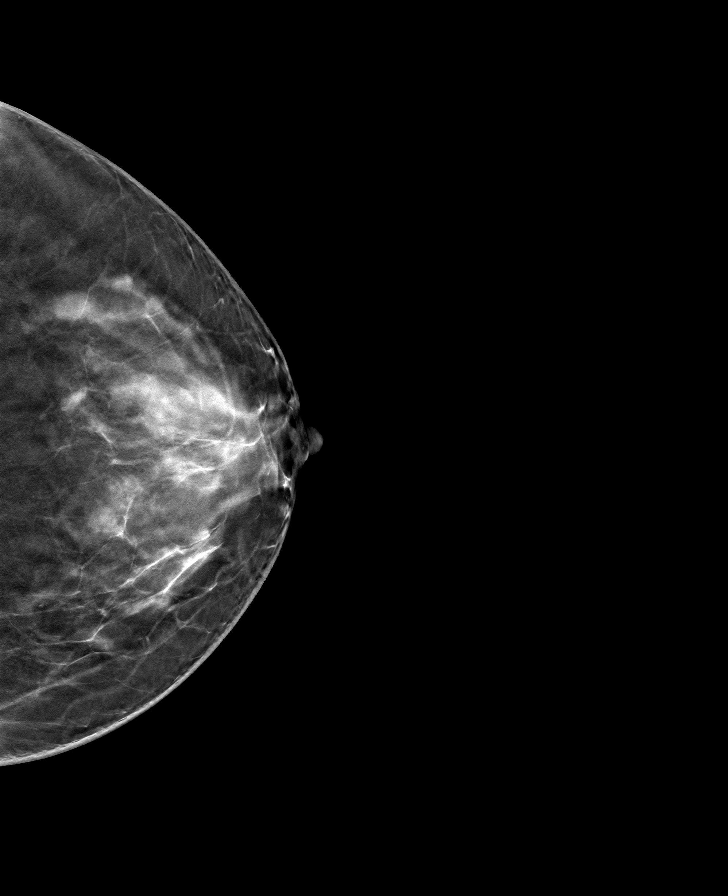

[L MLO tomo · tomo slice 35/70.0]
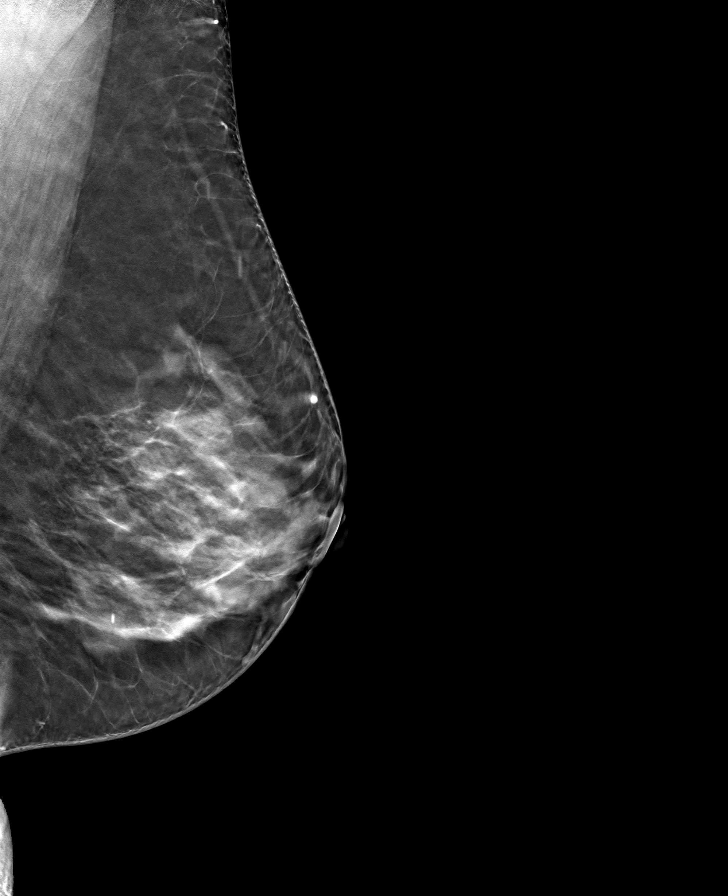

[8 of 24 positions shown; findings below may reference images not displayed]

ACR Breast Density Category c: The breast tissue is heterogeneously
dense, which may obscure small masses.
FINDINGS: There are no findings suspicious for malignancy.
IMPRESSION: No mammographic evidence of malignancy. A result letter of this
screening mammogram will be mailed directly to the patient.

RECOMMENDATION:
Screening mammogram in one year. (Code:Q3-W-BC3)

BI-RADS CATEGORY  1: Negative.

## 2024-03-30 ENCOUNTER — Other Ambulatory Visit: Payer: Self-pay | Admitting: Certified Nurse Midwife

## 2024-03-30 DIAGNOSIS — Z1231 Encounter for screening mammogram for malignant neoplasm of breast: Secondary | ICD-10-CM

## 2024-04-14 DIAGNOSIS — G8929 Other chronic pain: Secondary | ICD-10-CM | POA: Diagnosis not present

## 2024-04-14 DIAGNOSIS — E7800 Pure hypercholesterolemia, unspecified: Secondary | ICD-10-CM | POA: Diagnosis not present

## 2024-04-14 DIAGNOSIS — G43009 Migraine without aura, not intractable, without status migrainosus: Secondary | ICD-10-CM | POA: Diagnosis not present

## 2024-04-14 DIAGNOSIS — M5441 Lumbago with sciatica, right side: Secondary | ICD-10-CM | POA: Diagnosis not present

## 2024-04-14 DIAGNOSIS — M25562 Pain in left knee: Secondary | ICD-10-CM | POA: Diagnosis not present

## 2024-04-25 ENCOUNTER — Ambulatory Visit
Admission: RE | Admit: 2024-04-25 | Discharge: 2024-04-25 | Disposition: A | Discharge status: Home / Self Care | Source: Ambulatory Visit | Attending: Certified Nurse Midwife | Admitting: Certified Nurse Midwife

## 2024-04-25 DIAGNOSIS — Z1231 Encounter for screening mammogram for malignant neoplasm of breast: Secondary | ICD-10-CM | POA: Insufficient documentation

## 2024-05-10 DIAGNOSIS — Z0000 Encounter for general adult medical examination without abnormal findings: Secondary | ICD-10-CM | POA: Diagnosis not present

## 2024-07-05 DIAGNOSIS — M549 Dorsalgia, unspecified: Secondary | ICD-10-CM | POA: Diagnosis not present

## 2024-07-05 DIAGNOSIS — E7800 Pure hypercholesterolemia, unspecified: Secondary | ICD-10-CM | POA: Diagnosis not present

## 2024-07-05 DIAGNOSIS — M5441 Lumbago with sciatica, right side: Secondary | ICD-10-CM | POA: Diagnosis not present

## 2024-07-05 DIAGNOSIS — G43009 Migraine without aura, not intractable, without status migrainosus: Secondary | ICD-10-CM | POA: Diagnosis not present

## 2024-07-11 ENCOUNTER — Other Ambulatory Visit: Payer: Self-pay | Admitting: Internal Medicine

## 2024-07-11 DIAGNOSIS — M5441 Lumbago with sciatica, right side: Secondary | ICD-10-CM

## 2024-08-01 ENCOUNTER — Ambulatory Visit
Admission: RE | Admit: 2024-08-01 | Discharge: 2024-08-01 | Disposition: A | Discharge status: Home / Self Care | Source: Ambulatory Visit | Attending: Internal Medicine | Admitting: Internal Medicine

## 2024-08-01 DIAGNOSIS — M48061 Spinal stenosis, lumbar region without neurogenic claudication: Secondary | ICD-10-CM | POA: Diagnosis not present

## 2024-08-01 DIAGNOSIS — M5441 Lumbago with sciatica, right side: Secondary | ICD-10-CM | POA: Insufficient documentation

## 2024-08-01 DIAGNOSIS — M5126 Other intervertebral disc displacement, lumbar region: Secondary | ICD-10-CM | POA: Diagnosis not present

## 2024-08-01 DIAGNOSIS — M5431 Sciatica, right side: Secondary | ICD-10-CM | POA: Diagnosis not present

## 2024-08-01 DIAGNOSIS — M5136 Other intervertebral disc degeneration, lumbar region with discogenic back pain only: Secondary | ICD-10-CM | POA: Diagnosis not present

## 2024-08-12 DIAGNOSIS — H5213 Myopia, bilateral: Secondary | ICD-10-CM | POA: Diagnosis not present
